# Patient Record
Sex: Male | Born: 1975 | Race: Black or African American | Hispanic: No | Marital: Single
Health system: Southern US, Community
[De-identification: ages and names within clinical notes are randomized; demographics above are authoritative.]

---

## 2009-08-18 ENCOUNTER — Emergency Department (HOSPITAL_COMMUNITY): Admission: EM | Admit: 2009-08-18 | Discharge: 2009-08-19 | Payer: Self-pay | Admitting: Emergency Medicine

## 2009-09-07 ENCOUNTER — Ambulatory Visit (HOSPITAL_COMMUNITY): Admission: RE | Admit: 2009-09-07 | Discharge: 2009-09-08 | Payer: Self-pay | Admitting: Orthopaedic Surgery

## 2010-04-26 IMAGING — CR DG ANKLE COMPLETE 3+V*R*
3 series · 3 of 3 positions shown · non-contrast
Comparison: None

CLINICAL DATA: Ankle pain and swelling; felt pop while ambulating
on 08/17/2009

RIGHT ANKLE - COMPLETE 3+ VIEW

[t ankle joint ap right]
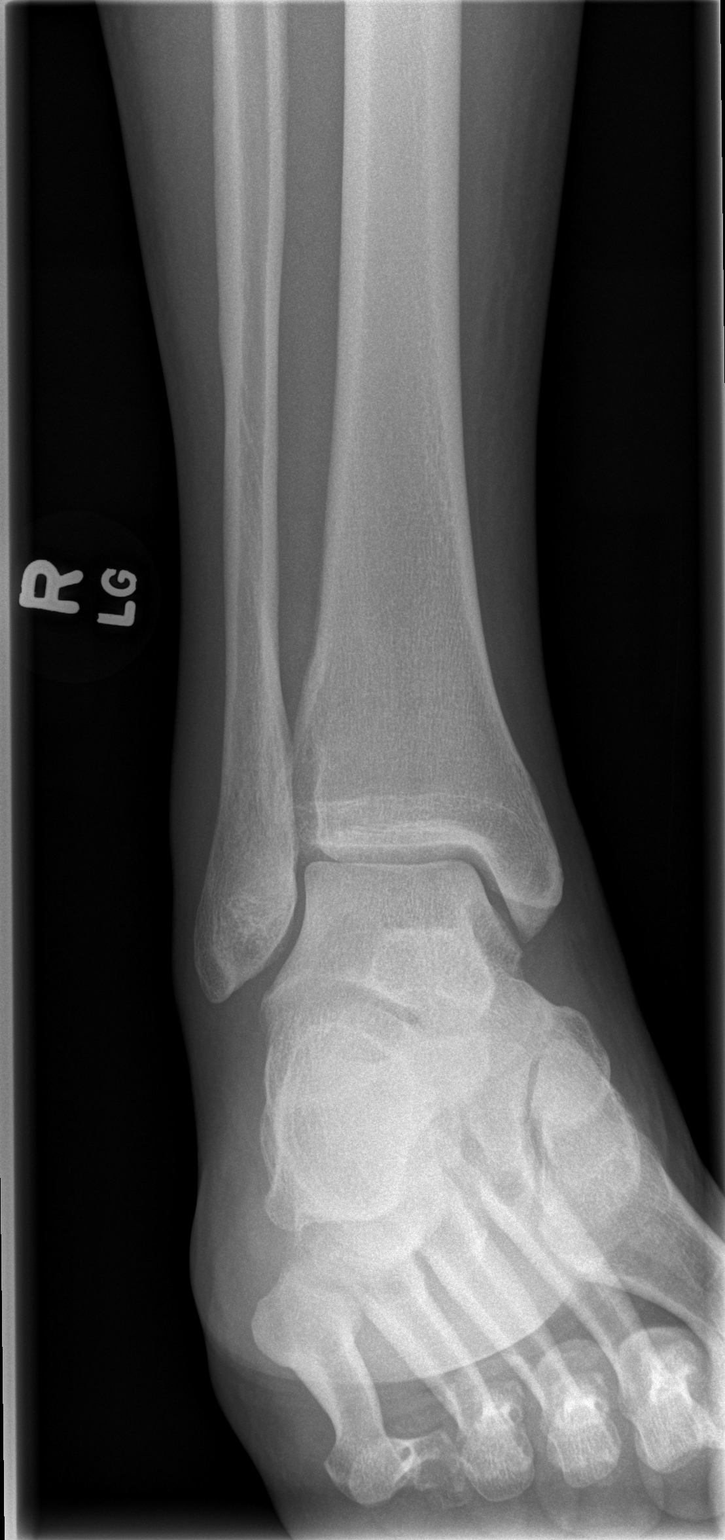

[t ankle joint oblique right]
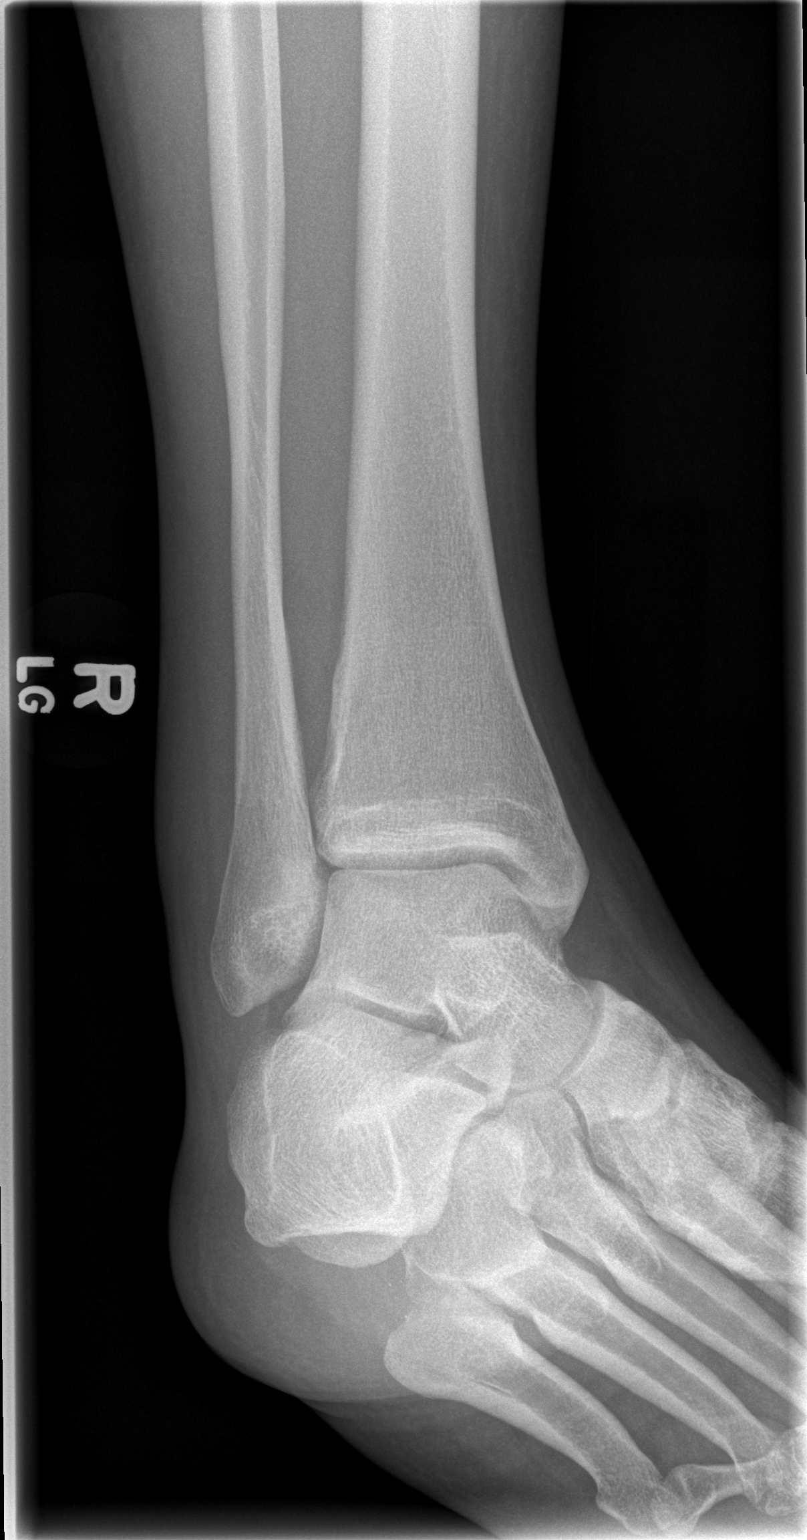

[t ankle joint lat right]
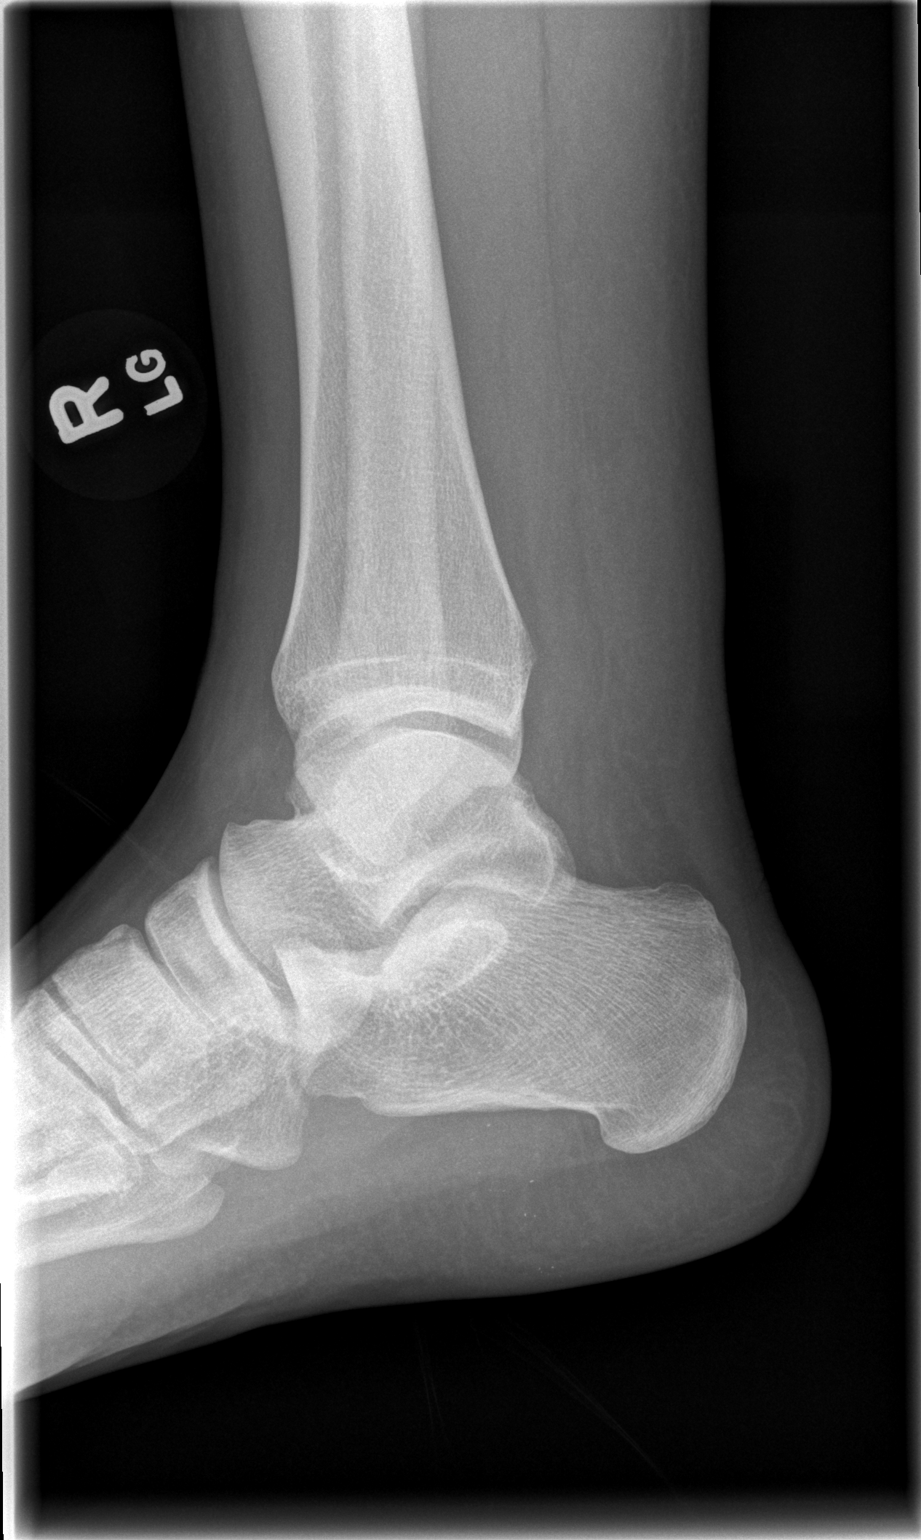

[3 of 3 positions shown; findings below may reference images not displayed]

FINDINGS: There is no evidence of fracture or dislocation.  The
ankle mortise is intact; the interosseous space is within normal
limits.  No talar tilt or subluxation is seen.

The joint spaces are preserved.  Mild soft tissue swelling may be
present, but is difficult to fully characterize on radiograph.
IMPRESSION: No evidence of fracture or dislocation.

## 2010-05-15 IMAGING — CR DG CHEST 2V
1 series · 1 of 1 positions shown · non-contrast
Comparison: None.

CLINICAL DATA: 33-year-old male preoperative study.

CHEST - 2 VIEW

[view not recorded]
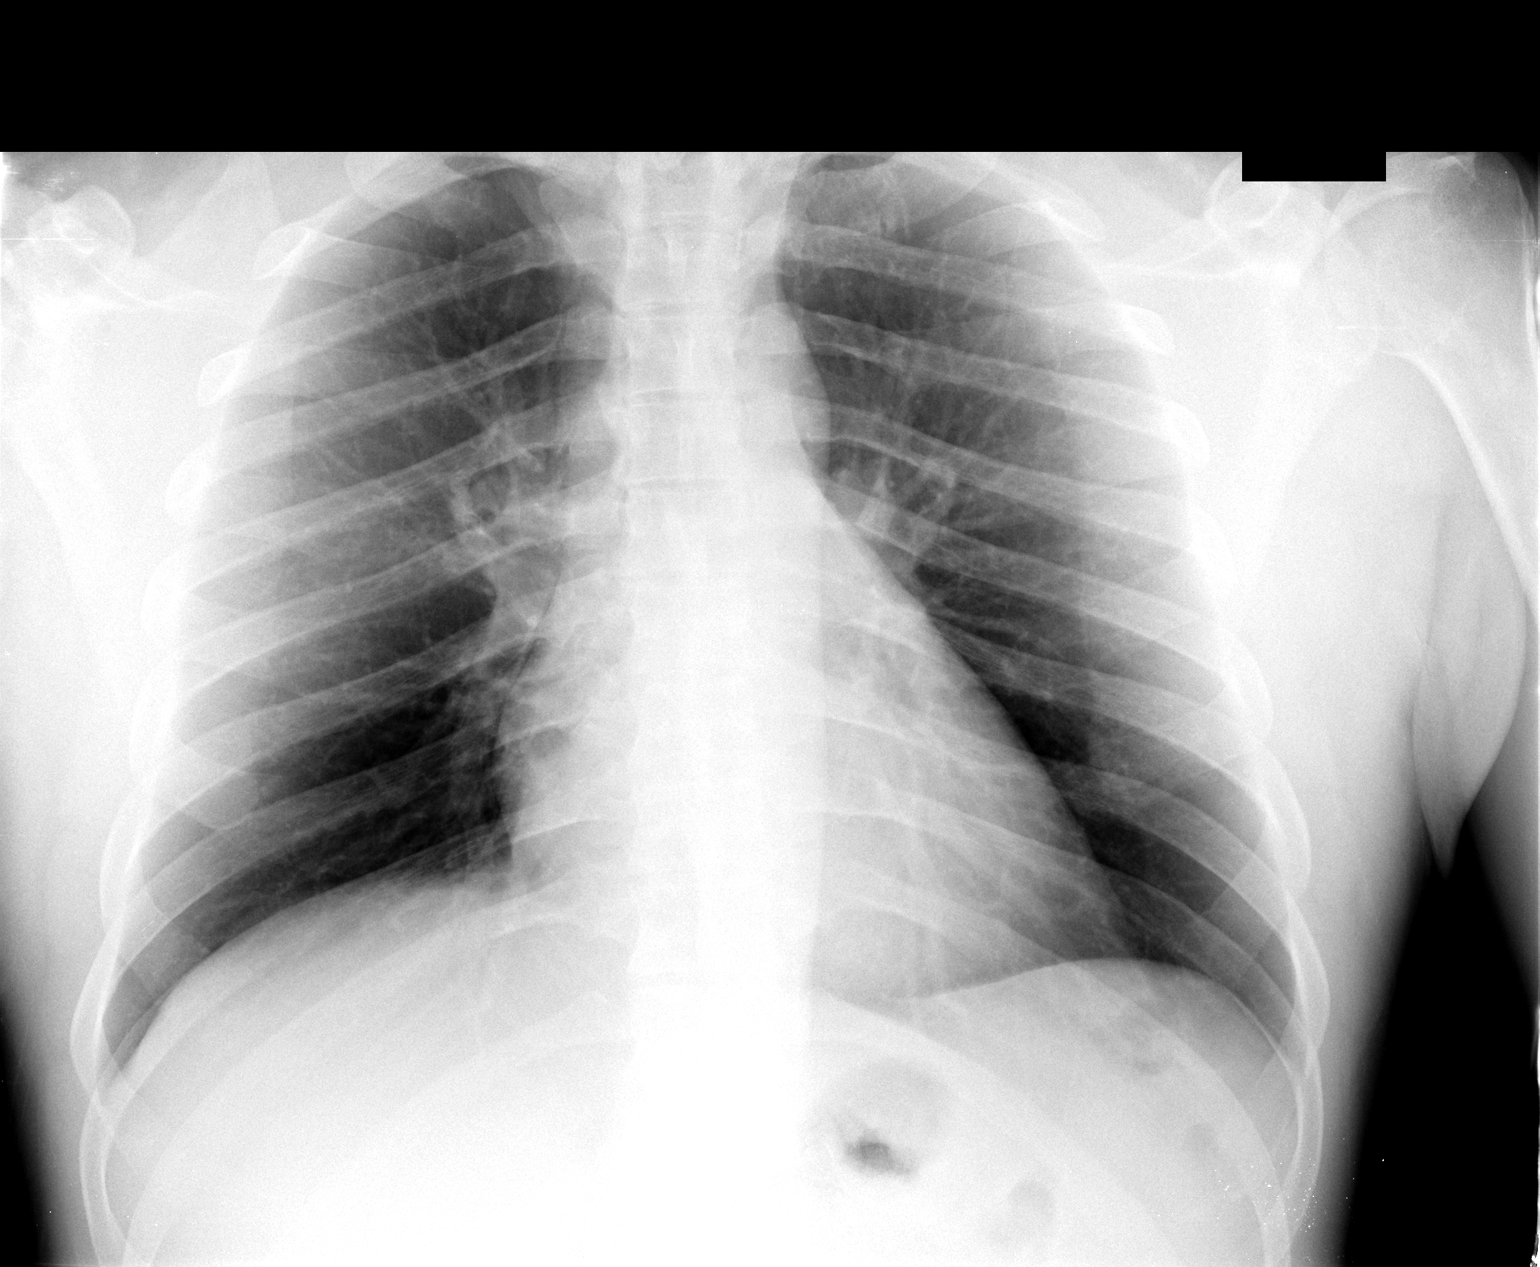

[1 of 1 positions shown; findings below may reference images not displayed]

FINDINGS: Cardiac size and mediastinal contours are within normal
limits.  Visualized tracheal air column is within normal limits.
Lung volumes are within normal limits.  The lungs are clear. No
acute osseous abnormality identified.
IMPRESSION: No acute cardiopulmonary abnormality.

## 2011-04-03 LAB — DIFFERENTIAL
Basophils Absolute: 0 10*3/uL (ref 0.0–0.1)
Eosinophils Absolute: 0.1 10*3/uL (ref 0.0–0.7)
Lymphocytes Relative: 39 % (ref 12–46)
Lymphs Abs: 2.9 10*3/uL (ref 0.7–4.0)
Neutrophils Relative %: 50 % (ref 43–77)

## 2011-04-03 LAB — CBC
HCT: 42.5 % (ref 39.0–52.0)
Hemoglobin: 14.4 g/dL (ref 13.0–17.0)
MCHC: 33.8 g/dL (ref 30.0–36.0)
MCV: 95.9 fL (ref 78.0–100.0)
RBC: 4.43 MIL/uL (ref 4.22–5.81)

## 2011-04-03 LAB — COMPREHENSIVE METABOLIC PANEL
CO2: 27 mEq/L (ref 19–32)
Calcium: 9.1 mg/dL (ref 8.4–10.5)
Creatinine, Ser: 1 mg/dL (ref 0.4–1.5)
GFR calc non Af Amer: >60 mL/min (ref >60)
Glucose, Bld: 98 mg/dL (ref 70–99)

## 2011-04-03 LAB — PROTIME-INR
INR: 1 (ref 0.00–1.49)
Prothrombin Time: 12.7 seconds (ref 11.6–15.2)

## 2022-11-01 ENCOUNTER — Emergency Department (HOSPITAL_COMMUNITY)
Admission: EM | Admit: 2022-11-01 | Discharge: 2022-11-01 | Disposition: A | Discharge status: Home / Self Care | Payer: 59 | Attending: Emergency Medicine | Admitting: Emergency Medicine

## 2022-11-01 DIAGNOSIS — N342 Other urethritis: Secondary | ICD-10-CM | POA: Diagnosis not present

## 2022-11-01 DIAGNOSIS — R300 Dysuria: Secondary | ICD-10-CM

## 2022-11-01 LAB — URINALYSIS, ROUTINE W REFLEX MICROSCOPIC
Bacteria, UA: NONE SEEN
Bilirubin Urine: NEGATIVE
Glucose, UA: NEGATIVE mg/dL
Hgb urine dipstick: NEGATIVE
Ketones, ur: NEGATIVE mg/dL
Nitrite: NEGATIVE
Protein, ur: NEGATIVE mg/dL
Specific Gravity, Urine: 1.019 (ref 1.005–1.030)
pH: 5 (ref 5.0–8.0)

## 2022-11-01 MED ORDER — CEFTRIAXONE SODIUM 500 MG IJ SOLR
500.0000 mg | Freq: Once | INTRAMUSCULAR | Status: AC
  Administered 2022-11-01: 500 mg via INTRAMUSCULAR
  Filled 2022-11-01: qty 500

## 2022-11-01 MED ORDER — DOXYCYCLINE HYCLATE 100 MG PO TABS
100.0000 mg | ORAL_TABLET | Freq: Once | ORAL | Status: AC
  Administered 2022-11-01: 100 mg via ORAL
  Filled 2022-11-01: qty 1

## 2022-11-01 MED ORDER — DOXYCYCLINE HYCLATE 100 MG PO CAPS: 100.0000 mg | ORAL_CAPSULE | Freq: Two times a day (BID) | ORAL | 0 refills | Status: AC

## 2022-11-01 MED ORDER — STERILE WATER FOR INJECTION IJ SOLN
INTRAMUSCULAR | Status: AC
  Administered 2022-11-01: 10 mL
  Filled 2022-11-01: qty 10

## 2022-11-01 NOTE — ED Provider Notes (Signed)
[facility] EMERGENCY DEPARTMENT Provider Note   CSN: [id number] Arrival date & time: 11/01/22  1641     History  Chief Complaint  Patient presents with   Dysuria    Steve Landry is a 46 y.o. male.  Patient seen by myself on arrival in triage.  Pt complains of dysuria x1 week.  He is here requesting treatment for "all STDs".  Denies any external sores or lesions.  Denies any penile discharge or drainage.  No sore throat or fevers.       Home Medications Prior to Admission medications   Medication Sig Start Date End Date Taking? Authorizing Provider  doxycycline (VIBRAMYCIN) 100 MG capsule Take 1 capsule (100 mg total) by mouth 2 (two) times daily. 11/01/22  Yes Renne Crigler, PA-C      Allergies    Patient has no allergy information on record.    Review of Systems   Review of Systems  Physical Exam Updated Vital Signs BP (!) 130/107 (BP Location: Right Arm)   Pulse 74   Temp 98.2 F (36.8 C) (Oral)   Resp 16   SpO2 99%   Physical Exam Vitals and nursing note reviewed.  Constitutional:      Appearance: He is well-developed.  HENT:     Head: Normocephalic and atraumatic.  Eyes:     Conjunctiva/sclera: Conjunctivae normal.  Pulmonary:     Effort: No respiratory distress.  Genitourinary:    Comments: Defers GU exam Musculoskeletal:     Cervical back: Normal range of motion and neck supple.  Skin:    General: Skin is warm and dry.  Neurological:     Mental Status: He is alert.     ED Results / Procedures / Treatments   Labs (all labs ordered are listed, but only abnormal results are displayed) Labs Reviewed  URINALYSIS, ROUTINE W REFLEX MICROSCOPIC - Abnormal; Notable for the following components:      Result Value   Leukocytes,Ua TRACE (*)    All other components within normal limits  GC/CHLAMYDIA PROBE AMP ([place]) NOT AT [facility]    EKG None  Radiology No results found.  Procedures Procedures    Medications  Ordered in ED Medications  doxycycline (VIBRA-TABS) tablet 100 mg (has no administration in time range)  cefTRIAXone (ROCEPHIN) injection 500 mg (has no administration in time range)    ED Course/ Medical Decision Making/ A&P    Patient seen and examined. History obtained directly from patient. Work-up including labs, imaging, EKG ordered in triage, if performed, were reviewed.    Labs/EKG: UA with some white blood cells.  Imaging: None ordered  Medications/Fluids: Ordered: IM Rocephin, p.o. doxycycline  Most recent vital signs reviewed and are as follows: BP (!) 130/107 (BP Location: Right Arm)   Pulse 74   Temp 98.2 F (36.8 C) (Oral)   Resp 16   SpO2 99%   Initial impression: Dysuria, possible urethritis  Home treatment plan: Patient counseled on safe sexual practices, encouraged them to avoid sexual contact for 7 days and to inform sexual partners so that they can get tested and treated as well.   Return instructions discussed with patient: Return with worsening, fever, vomiting  Follow-up instructions discussed with patient: Follow-up with PCP or health department as needed                          Medical Decision Making Amount and/or Complexity of Data Reviewed Labs: ordered.  Risk Prescription drug management.   Patient with dysuria, concern for STI.  No associated abdominal pain.  Low concern for pyelonephritis or cystitis at this point given history.  Patient denies external symptoms.  The patient's vital signs, pertinent lab work and imaging were reviewed and interpreted as discussed in the ED course. Hospitalization was considered for further testing, treatments, or serial exams/observation. However as patient is well-appearing, has a stable exam, and reassuring studies today, I do not feel that they warrant admission at this time. This plan was discussed with the patient who verbalizes agreement and comfort with this plan and seems reliable and able to return  to the Emergency Department with worsening or changing symptoms.          Final Clinical Impression(s) / ED Diagnoses Final diagnoses:  Dysuria  Urethritis    Rx / DC Orders ED Discharge Orders          Ordered    doxycycline (VIBRAMYCIN) 100 MG capsule  2 times daily        11/01/22 2152              Carlisle Cater, PA-C 11/01/22 2157    Sherwood Gambler, MD 11/05/22 1948

## 2022-11-01 NOTE — Discharge Instructions (Signed)
Please read and follow all provided instructions.  Your diagnoses today include:  1. Dysuria   2. Urethritis     Tests performed today include: Test for gonorrhea and chlamydia.  Urine test: does show some infection fighting cells  Vital signs. See below for your results today.   Medications:  For treatment of gonorrhea: You were treated with a rocephin (shot) today.   For treatment of chlamydia: You were given a prescription for doxycycline to take twice a day for 7 days.  Home care instructions:  Read educational materials contained in this packet and follow any instructions provided.   You should tell your partners about your infection and avoid having sex for one week to allow time for the medicine to work.  Sexually transmitted disease testing also available at:  [place] [address], [place], phone [phone] or [phone]   Monday - Friday, call for an appointment  Return instructions:  Please return to the Emergency Department if you experience worsening symptoms.  Please return if you have any other emergent concerns.  Additional Information:  Your vital signs today were: BP (!) 130/107 (BP Location: Right Arm)   Pulse 74   Temp 98.2 F (36.8 C) (Oral)   Resp 16   SpO2 99%  If your blood pressure (BP) was elevated above 135/85 this visit, please have this repeated by your doctor within one month. --------------

## 2022-11-01 NOTE — ED Triage Notes (Signed)
Pt c/o dysuria x1wk; requesting STD testing. Denies additional symptoms

## 2022-11-01 NOTE — ED Provider Triage Note (Signed)
Emergency Medicine Provider Triage Evaluation Note  Steve Landry , a 46 y.o. male  was evaluated in triage.  Pt complains of dysuria x1 week.  He is here requesting treatment for "all STDs".  Denies any external sores or lesions.  Denies any penile discharge or drainage.  No sore throat or fevers.  Review of Systems  Positive: Dysuria Negative: Urethral discharge  Physical Exam  BP (!) 143/101   Pulse 91   Temp 98.4 F (36.9 C) (Oral)   Resp (!) 24   SpO2 98%  Gen:   Awake, no distress   Resp:  Normal effort  MSK:   Moves extremities without difficulty  Other:  Abdomen soft and nontender  Medical Decision Making  Medically screening exam initiated at 5:08 PM.  Appropriate orders placed.  Steve Landry was informed that the remainder of the evaluation will be completed by another provider, this initial triage assessment does not replace that evaluation, and the importance of remaining in the ED until their evaluation is complete.     Carlisle Cater, PA-C 11/01/22 1709
